# Patient Record
Sex: Female | Born: 2000 | Race: Black or African American | Hispanic: No | Marital: Single
Health system: Southern US, Community
[De-identification: ages and names within clinical notes are randomized; demographics above are authoritative.]

## PROBLEM LIST (undated history)

## (undated) DIAGNOSIS — N319 Neuromuscular dysfunction of bladder, unspecified: Secondary | ICD-10-CM

## (undated) DIAGNOSIS — Q068 Other specified congenital malformations of spinal cord: Secondary | ICD-10-CM

## (undated) HISTORY — PX: SPINAL CORD UNTETHERING: SHX2425

## (undated) HISTORY — PX: UPPER GI ENDOSCOPY: SHX6162

---
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
## 2018-01-13 DIAGNOSIS — Z7722 Contact with and (suspected) exposure to environmental tobacco smoke (acute) (chronic): Secondary | ICD-10-CM | POA: Diagnosis not present

## 2018-01-13 DIAGNOSIS — J4520 Mild intermittent asthma, uncomplicated: Secondary | ICD-10-CM | POA: Diagnosis not present

## 2018-01-13 DIAGNOSIS — L501 Idiopathic urticaria: Secondary | ICD-10-CM | POA: Diagnosis not present

## 2018-02-04 DIAGNOSIS — R768 Other specified abnormal immunological findings in serum: Secondary | ICD-10-CM | POA: Diagnosis not present

## 2018-02-04 DIAGNOSIS — L509 Urticaria, unspecified: Secondary | ICD-10-CM | POA: Diagnosis not present

## 2018-06-23 DIAGNOSIS — L501 Idiopathic urticaria: Secondary | ICD-10-CM | POA: Diagnosis not present

## 2018-06-23 DIAGNOSIS — J45990 Exercise induced bronchospasm: Secondary | ICD-10-CM | POA: Diagnosis not present

## 2018-06-23 DIAGNOSIS — T783XXA Angioneurotic edema, initial encounter: Secondary | ICD-10-CM | POA: Diagnosis not present

## 2018-06-23 DIAGNOSIS — J3089 Other allergic rhinitis: Secondary | ICD-10-CM | POA: Diagnosis not present

## 2018-06-25 DIAGNOSIS — R05 Cough: Secondary | ICD-10-CM | POA: Diagnosis not present

## 2018-06-25 DIAGNOSIS — L509 Urticaria, unspecified: Secondary | ICD-10-CM | POA: Diagnosis not present

## 2018-10-22 DIAGNOSIS — N319 Neuromuscular dysfunction of bladder, unspecified: Secondary | ICD-10-CM | POA: Diagnosis not present

## 2018-10-22 DIAGNOSIS — L508 Other urticaria: Secondary | ICD-10-CM | POA: Diagnosis not present

## 2018-10-22 DIAGNOSIS — J029 Acute pharyngitis, unspecified: Secondary | ICD-10-CM | POA: Diagnosis not present

## 2018-11-10 DIAGNOSIS — R3121 Asymptomatic microscopic hematuria: Secondary | ICD-10-CM | POA: Diagnosis not present

## 2018-11-10 DIAGNOSIS — Z113 Encounter for screening for infections with a predominantly sexual mode of transmission: Secondary | ICD-10-CM | POA: Diagnosis not present

## 2018-11-10 DIAGNOSIS — R309 Painful micturition, unspecified: Secondary | ICD-10-CM | POA: Diagnosis not present

## 2018-11-23 DIAGNOSIS — L089 Local infection of the skin and subcutaneous tissue, unspecified: Secondary | ICD-10-CM | POA: Diagnosis not present

## 2018-11-23 DIAGNOSIS — L03116 Cellulitis of left lower limb: Secondary | ICD-10-CM | POA: Diagnosis not present

## 2019-01-21 DIAGNOSIS — J3089 Other allergic rhinitis: Secondary | ICD-10-CM | POA: Diagnosis not present

## 2019-01-21 DIAGNOSIS — H1013 Acute atopic conjunctivitis, bilateral: Secondary | ICD-10-CM | POA: Diagnosis not present

## 2019-01-21 DIAGNOSIS — L501 Idiopathic urticaria: Secondary | ICD-10-CM | POA: Diagnosis not present

## 2019-03-11 DIAGNOSIS — J3089 Other allergic rhinitis: Secondary | ICD-10-CM | POA: Diagnosis not present

## 2019-03-11 DIAGNOSIS — H1013 Acute atopic conjunctivitis, bilateral: Secondary | ICD-10-CM | POA: Diagnosis not present

## 2019-08-16 DIAGNOSIS — R0989 Other specified symptoms and signs involving the circulatory and respiratory systems: Secondary | ICD-10-CM | POA: Diagnosis not present

## 2019-08-16 DIAGNOSIS — Z20822 Contact with and (suspected) exposure to covid-19: Secondary | ICD-10-CM | POA: Diagnosis not present

## 2019-08-16 DIAGNOSIS — R05 Cough: Secondary | ICD-10-CM | POA: Diagnosis not present

## 2019-09-12 DIAGNOSIS — L03032 Cellulitis of left toe: Secondary | ICD-10-CM | POA: Diagnosis not present

## 2019-09-13 DIAGNOSIS — Z9889 Other specified postprocedural states: Secondary | ICD-10-CM | POA: Diagnosis not present

## 2019-09-13 DIAGNOSIS — Z8669 Personal history of other diseases of the nervous system and sense organs: Secondary | ICD-10-CM | POA: Diagnosis not present

## 2019-09-13 DIAGNOSIS — M545 Low back pain: Secondary | ICD-10-CM | POA: Diagnosis not present

## 2019-09-13 DIAGNOSIS — N319 Neuromuscular dysfunction of bladder, unspecified: Secondary | ICD-10-CM | POA: Diagnosis not present

## 2019-09-13 DIAGNOSIS — G8929 Other chronic pain: Secondary | ICD-10-CM | POA: Diagnosis not present

## 2019-10-08 DIAGNOSIS — Z0000 Encounter for general adult medical examination without abnormal findings: Secondary | ICD-10-CM | POA: Diagnosis not present

## 2019-10-26 DIAGNOSIS — G894 Chronic pain syndrome: Secondary | ICD-10-CM | POA: Diagnosis not present

## 2019-10-26 DIAGNOSIS — M6281 Muscle weakness (generalized): Secondary | ICD-10-CM | POA: Diagnosis not present

## 2019-10-26 DIAGNOSIS — M545 Low back pain: Secondary | ICD-10-CM | POA: Diagnosis not present

## 2019-11-02 DIAGNOSIS — G8929 Other chronic pain: Secondary | ICD-10-CM | POA: Diagnosis not present

## 2019-11-02 DIAGNOSIS — M6281 Muscle weakness (generalized): Secondary | ICD-10-CM | POA: Diagnosis not present

## 2019-11-02 DIAGNOSIS — G894 Chronic pain syndrome: Secondary | ICD-10-CM | POA: Diagnosis not present

## 2019-11-02 DIAGNOSIS — M545 Low back pain: Secondary | ICD-10-CM | POA: Diagnosis not present

## 2019-11-04 DIAGNOSIS — G894 Chronic pain syndrome: Secondary | ICD-10-CM | POA: Diagnosis not present

## 2019-11-04 DIAGNOSIS — M6281 Muscle weakness (generalized): Secondary | ICD-10-CM | POA: Diagnosis not present

## 2019-11-04 DIAGNOSIS — M545 Low back pain: Secondary | ICD-10-CM | POA: Diagnosis not present

## 2019-11-04 DIAGNOSIS — G8929 Other chronic pain: Secondary | ICD-10-CM | POA: Diagnosis not present
